# Patient Record
Sex: Male | Born: 1970 | Race: Black or African American | Hispanic: No | Marital: Single | Smoking: Current every day smoker
Health system: Southern US, Community
[De-identification: ages and names within clinical notes are randomized; demographics above are authoritative.]

## PROBLEM LIST (undated history)

## (undated) HISTORY — PX: KNEE SURGERY: SHX244

---

## 2009-02-20 ENCOUNTER — Emergency Department (HOSPITAL_COMMUNITY): Admission: EM | Admit: 2009-02-20 | Discharge: 2009-02-20 | Payer: Self-pay | Admitting: Emergency Medicine

## 2009-03-01 ENCOUNTER — Ambulatory Visit (HOSPITAL_BASED_OUTPATIENT_CLINIC_OR_DEPARTMENT_OTHER): Admission: RE | Admit: 2009-03-01 | Discharge: 2009-03-01 | Payer: Self-pay | Admitting: Plastic Surgery

## 2009-04-04 ENCOUNTER — Ambulatory Visit (HOSPITAL_COMMUNITY): Admission: RE | Admit: 2009-04-04 | Discharge: 2009-04-04 | Payer: Self-pay | Admitting: Plastic Surgery
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
## 2017-10-14 ENCOUNTER — Encounter: Payer: Self-pay | Admitting: Emergency Medicine

## 2017-10-14 ENCOUNTER — Emergency Department: Payer: BLUE CROSS/BLUE SHIELD

## 2017-10-14 ENCOUNTER — Emergency Department
Admission: EM | Admit: 2017-10-14 | Discharge: 2017-10-14 | Disposition: A | Discharge status: Home / Self Care | Payer: BLUE CROSS/BLUE SHIELD | Attending: Emergency Medicine | Admitting: Emergency Medicine

## 2017-10-14 DIAGNOSIS — W01190A Fall on same level from slipping, tripping and stumbling with subsequent striking against furniture, initial encounter: Secondary | ICD-10-CM | POA: Diagnosis not present

## 2017-10-14 DIAGNOSIS — Y929 Unspecified place or not applicable: Secondary | ICD-10-CM | POA: Diagnosis not present

## 2017-10-14 DIAGNOSIS — S0501XA Injury of conjunctiva and corneal abrasion without foreign body, right eye, initial encounter: Secondary | ICD-10-CM

## 2017-10-14 DIAGNOSIS — Y999 Unspecified external cause status: Secondary | ICD-10-CM | POA: Diagnosis not present

## 2017-10-14 DIAGNOSIS — S0990XA Unspecified injury of head, initial encounter: Secondary | ICD-10-CM | POA: Diagnosis present

## 2017-10-14 DIAGNOSIS — F17210 Nicotine dependence, cigarettes, uncomplicated: Secondary | ICD-10-CM | POA: Insufficient documentation

## 2017-10-14 DIAGNOSIS — Y939 Activity, unspecified: Secondary | ICD-10-CM | POA: Diagnosis not present

## 2017-10-14 LAB — COMPREHENSIVE METABOLIC PANEL
ALT: 18 U/L (ref 17–63)
AST: 23 U/L (ref 15–41)
Albumin: 4 g/dL (ref 3.5–5.0)
Alkaline Phosphatase: 46 U/L (ref 38–126)
Anion gap: 6 (ref 5–15)
BUN: 12 mg/dL (ref 6–20)
CO2: 24 mmol/L (ref 22–32)
Calcium: 9 mg/dL (ref 8.9–10.3)
Chloride: 108 mmol/L (ref 101–111)
Creatinine, Ser: 1.18 mg/dL (ref 0.61–1.24)
GFR calc Af Amer: >60 mL/min (ref >60)
GFR calc non Af Amer: >60 mL/min (ref >60)
Glucose, Bld: 104 mg/dL — ABNORMAL HIGH (ref 65–99)
Potassium: 4 mmol/L (ref 3.5–5.1)
Sodium: 138 mmol/L (ref 135–145)
Total Bilirubin: 0.7 mg/dL (ref 0.3–1.2)
Total Protein: 7.5 g/dL (ref 6.5–8.1)

## 2017-10-14 LAB — CBC WITH DIFFERENTIAL/PLATELET
Basophils Absolute: 0.1 10*3/uL (ref 0–0.1)
Basophils Relative: 1 %
Eosinophils Absolute: 0.6 10*3/uL (ref 0–0.7)
Eosinophils Relative: 8 %
HCT: 43.6 % (ref 40.0–52.0)
Hemoglobin: 14.7 g/dL (ref 13.0–18.0)
Lymphocytes Relative: 22 %
Lymphs Abs: 1.8 10*3/uL (ref 1.0–3.6)
MCH: 28.9 pg (ref 26.0–34.0)
MCHC: 33.7 g/dL (ref 32.0–36.0)
MCV: 85.7 fL (ref 80.0–100.0)
Monocytes Absolute: 0.5 10*3/uL (ref 0.2–1.0)
Monocytes Relative: 7 %
Neutro Abs: 4.9 10*3/uL (ref 1.4–6.5)
Neutrophils Relative %: 62 %
Platelets: 216 10*3/uL (ref 150–440)
RBC: 5.08 MIL/uL (ref 4.40–5.90)
RDW: 14.1 % (ref 11.5–14.5)
WBC: 7.9 10*3/uL (ref 3.8–10.6)

## 2017-10-14 MED ORDER — FLUORESCEIN SODIUM 1 MG OP STRP
1.0000 | ORAL_STRIP | Freq: Once | OPHTHALMIC | Status: AC
  Administered 2017-10-14: 1 via OPHTHALMIC
  Filled 2017-10-14: qty 1

## 2017-10-14 MED ORDER — TETRACAINE HCL 0.5 % OP SOLN
1.0000 [drp] | Freq: Once | OPHTHALMIC | Status: AC
  Administered 2017-10-14: 1 [drp] via OPHTHALMIC
  Filled 2017-10-14: qty 4

## 2017-10-14 MED ORDER — DICLOFENAC SODIUM 0.1 % OP SOLN: 1.0000 [drp] | Freq: Four times a day (QID) | OPHTHALMIC | 0 refills | Status: AC

## 2017-10-14 MED ORDER — IOPAMIDOL (ISOVUE-300) INJECTION 61%
75.0000 mL | Freq: Once | INTRAVENOUS | Status: AC | PRN
  Administered 2017-10-14: 75 mL via INTRAVENOUS
  Filled 2017-10-14: qty 75

## 2017-10-14 MED ORDER — POLYMYXIN B-TRIMETHOPRIM 10000-0.1 UNIT/ML-% OP SOLN: 1.0000 [drp] | OPHTHALMIC | 0 refills | Status: AC

## 2017-10-14 NOTE — ED Notes (Signed)
See triage note  States he fell from standing position   Hitting coffee table on Saturday   conts to have headache and right eye pain

## 2017-10-14 NOTE — ED Provider Notes (Signed)
[facility] Emergency Department Provider Note  ____________________________________________  Time seen: Approximately 5:36 PM  I have reviewed the triage vital signs and the nursing notes.   HISTORY  Chief Complaint Eye Pain    HPI Sandi RavelingClifton J Krutz is a 47 y.o. male presents to the emergency department with 10 out of 10 right eye pain for the past 3 days.  Patient reports that 3 days ago, he lost his balance and struck his right forehead against the coffee table.  Patient did not lose consciousness.  Patient states that since fall, he has experienced increased tearing, foreign body sensation, photophobia and occasional blurry vision.  Patient also has pain circumferentially around right orbit.  Patient sustained two abrasions along right forehead and right temple during fall.  Patient wears glasses at baseline but has not been wearing contact lenses.  He denies nausea or vomiting and does not have a history of glaucoma.   History reviewed. No pertinent past medical history.  There are no active problems to display for this patient.   History reviewed. No pertinent surgical history.  Prior to Admission medications   Medication Sig Start Date End Date Taking? Authorizing Provider  diclofenac (VOLTAREN) 0.1 % ophthalmic solution Place 1 drop into the right eye 4 (four) times daily for 5 days. 10/14/17 10/19/17  Orvil FeilWoods, Patricia Fargo M, PA-C  trimethoprim-polymyxin b (POLYTRIM) ophthalmic solution Place 1 drop into the right eye every 4 (four) hours for 10 days. 10/14/17 10/24/17  Orvil FeilWoods, Voris Tigert M, PA-C    Allergies Patient has no known allergies.  No family history on file.  Social History Social History   Tobacco Use  . Smoking status: Current Every Day Smoker    Packs/day: 1.00    Types: Cigarettes  . Smokeless tobacco: Never Used  Substance Use Topics  . Alcohol use: Yes    Frequency: Never    Comment: occas   . Drug use: No     Review of Systems   Constitutional: No fever/chills Eyes: Patient has right eye pain.  ENT: No upper respiratory complaints. Cardiovascular: no chest pain. Respiratory: no cough. No SOB. Gastrointestinal: No abdominal pain.  No nausea, no vomiting.  No diarrhea.  No constipation. Genitourinary: Negative for dysuria. No hematuria Musculoskeletal: Negative for musculoskeletal pain. Skin: Negative for rash, abrasions, lacerations, ecchymosis. Neurological: Negative for headaches, focal weakness or numbness.   ____________________________________________   PHYSICAL EXAM:  VITAL SIGNS: ED Triage Vitals  Enc Vitals Group     BP 10/14/17 1618 139/77     Pulse Rate 10/14/17 1618 99     Resp 10/14/17 1618 16     Temp 10/14/17 1618 98.3 F (36.8 C)     Temp Source 10/14/17 1618 Oral     SpO2 10/14/17 1618 100 %     Weight 10/14/17 1619 (!) 312 lb (141.5 kg)     Height 10/14/17 1619 6\' 2"  (1.88 m)     Head Circumference --      Peak Flow --      Pain Score 10/14/17 1618 6     Pain Loc --      Pain Edu? --      Excl. in GC? --      Constitutional: Alert and oriented. Well appearing and in no acute distress. Eyes: Diffuse scleral injection of the right eye visualized.  Pupils are equal round and reactive to light bilaterally.  Extraocular eye muscles intact.  Patient has sensitivity to light on physical exam.  Patient  had small inferior right corneal abrasion visualized with fluorescein staining. Head: Atraumatic. ENT:      Ears: TMs are pearly bilaterally.      Nose: No congestion/rhinnorhea.      Mouth/Throat: Mucous membranes are moist.  Neck: No stridor. No cervical spine tenderness to palpation. Cardiovascular: Normal rate, regular rhythm. Normal S1 and S2.  Good peripheral circulation. Respiratory: Normal respiratory effort without tachypnea or retractions. Lungs CTAB. Good air entry to the bases with no decreased or absent breath sounds. Gastrointestinal: Bowel sounds 4 quadrants. Soft and  nontender to palpation. No guarding or rigidity. No palpable masses. No distention. No CVA tenderness. Musculoskeletal: Full range of motion to all extremities. No gross deformities appreciated. Neurologic:  Normal speech and language. No gross focal neurologic deficits are appreciated.  Skin:  Skin is warm, dry and intact. No rash noted. Psychiatric: Mood and affect are normal. Speech and behavior are normal. Patient exhibits appropriate insight and judgement.   ____________________________________________   LABS (all labs ordered are listed, but only abnormal results are displayed)  Labs Reviewed  COMPREHENSIVE METABOLIC PANEL - Abnormal; Notable for the following components:      Result Value   Glucose, Bld 104 (*)    All other components within normal limits  CBC WITH DIFFERENTIAL/PLATELET   ____________________________________________  EKG   ____________________________________________  RADIOLOGY Geraldo Pitter, personally viewed and evaluated these images as part of my medical decision making, as well as reviewing the written report by the radiologist.  Ct Head Wo Contrast  Result Date: 10/14/2017 CLINICAL DATA:  Right orbit and facial pain after fall 3 days ago. EXAM: CT HEAD WITHOUT CONTRAST CT MAXILLOFACIAL WITHOUT CONTRAST TECHNIQUE: Multidetector CT imaging of the head and maxillofacial structures were performed using the standard protocol without intravenous contrast. Multiplanar CT image reconstructions of the maxillofacial structures were also generated. COMPARISON:  None. FINDINGS: CT HEAD FINDINGS Brain: Ventricles, cisterns and other CSF spaces are within normal. There is no mass, mass effect, shift of midline structures or acute hemorrhage. No evidence of acute infarction. Vascular: No hyperdense vessel or unexpected calcification. Skull: Normal. Negative for fracture or focal lesion. Sinuses/Orbits: Orbits are within normal. Moderate opacification over the left  frontal sinus and complete opacification of the right maxillary sinus with mild opacification over the left maxillary sinus and ethmoid air cells. Mastoid air cells are clear. Other: None. CT MAXILLOFACIAL FINDINGS Osseous: No acute fracture. Orbits: Orbits are normal and symmetric. Sinuses: Complete opacification of the left frontal sinus with minimal opacification of the ethmoid air cells and left maxillary sinus. Near complete opacification of the right maxillary sinus. Mild deviation of the nasal septum to the left. Right ostiomeatal complex is opacified. Mastoid air cells are clear. Soft tissues: No significant soft tissue injury. There is a linear 2.5 cm radiopaque structure over the left neck extending through the posterior aspect of the sternocleidomastoid muscle at the C4 level. This likely represents a foreign body. IMPRESSION: No acute brain injury. No acute facial bone fracture. Moderate chronic sinus inflammatory change as described worse over the right maxillary sinus and left frontal sinus. 2.5 cm linear radiopaque object over the left neck extending to the posterior aspect of the sternocleidomastoid muscle at the level of C4 likely a foreign body. Electronically Signed   By: Elberta Fortis M.D.   On: 10/14/2017 18:52   Ct Maxillofacial W Contrast  Result Date: 10/14/2017 CLINICAL DATA:  Right orbit and facial pain after fall 3 days ago. EXAM: CT  HEAD WITHOUT CONTRAST CT MAXILLOFACIAL WITHOUT CONTRAST TECHNIQUE: Multidetector CT imaging of the head and maxillofacial structures were performed using the standard protocol without intravenous contrast. Multiplanar CT image reconstructions of the maxillofacial structures were also generated. COMPARISON:  None. FINDINGS: CT HEAD FINDINGS Brain: Ventricles, cisterns and other CSF spaces are within normal. There is no mass, mass effect, shift of midline structures or acute hemorrhage. No evidence of acute infarction. Vascular: No hyperdense vessel or  unexpected calcification. Skull: Normal. Negative for fracture or focal lesion. Sinuses/Orbits: Orbits are within normal. Moderate opacification over the left frontal sinus and complete opacification of the right maxillary sinus with mild opacification over the left maxillary sinus and ethmoid air cells. Mastoid air cells are clear. Other: None. CT MAXILLOFACIAL FINDINGS Osseous: No acute fracture. Orbits: Orbits are normal and symmetric. Sinuses: Complete opacification of the left frontal sinus with minimal opacification of the ethmoid air cells and left maxillary sinus. Near complete opacification of the right maxillary sinus. Mild deviation of the nasal septum to the left. Right ostiomeatal complex is opacified. Mastoid air cells are clear. Soft tissues: No significant soft tissue injury. There is a linear 2.5 cm radiopaque structure over the left neck extending through the posterior aspect of the sternocleidomastoid muscle at the C4 level. This likely represents a foreign body. IMPRESSION: No acute brain injury. No acute facial bone fracture. Moderate chronic sinus inflammatory change as described worse over the right maxillary sinus and left frontal sinus. 2.5 cm linear radiopaque object over the left neck extending to the posterior aspect of the sternocleidomastoid muscle at the level of C4 likely a foreign body. Electronically Signed   By: Elberta Fortis M.D.   On: 10/14/2017 18:52    ____________________________________________    PROCEDURES  Procedure(s) performed:    Procedures  Tonometry readings 22 23 22   Medications  tetracaine (PONTOCAINE) 0.5 % ophthalmic solution 1 drop (1 drop Right Eye Given 10/14/17 1940)  fluorescein ophthalmic strip 1 strip (1 strip Right Eye Given 10/14/17 1940)  iopamidol (ISOVUE-300) 61 % injection 75 mL (75 mLs Intravenous Contrast Given 10/14/17 1809)     ____________________________________________   INITIAL IMPRESSION / ASSESSMENT AND PLAN / ED  COURSE  Pertinent labs & imaging results that were available during my care of the patient were reviewed by me and considered in my medical decision making (see chart for details).  Review of the [place] CSRS was performed in accordance of the [facility] prior to dispensing any controlled drugs.     Assessment and Plan:  Corneal abrasion Patient presents to the emergency department with right eye pain for the past 3 days after experiencing a fall.  Differential diagnosis originally included angle-closure glaucoma, corneal abrasion, orbital cellulitis and foreign body.  CT head revealed no evidence of intracranial hemorrhage.  CT maxillofacial revealed no findings consistent with orbital cellulitis.  A small region of fluorescein uptake was visualized with fluorescein staining, increasing suspicion for corneal abrasion.  Tonometry readings were reassuring.  Patient was treated empirically for corneal abrasion with diclofenac ophthalmic solution and with Polytrim.  A referral was made to ophthalmology and patient was advised to make a follow-up appointment.  Strict return precautions were given to return to the emergency department given new or worsening symptoms.  Vital signs are reassuring prior to discharge. ____________________________________________  FINAL CLINICAL IMPRESSION(S) / ED DIAGNOSES  Final diagnoses:  Abrasion of right cornea, initial encounter      NEW MEDICATIONS STARTED DURING THIS VISIT:  ED Discharge Orders  Ordered    trimethoprim-polymyxin b (POLYTRIM) ophthalmic solution  Every 4 hours     10/14/17 1919    diclofenac (VOLTAREN) 0.1 % ophthalmic solution  4 times daily     10/14/17 1919          This chart was dictated using voice recognition software/Dragon. Despite best efforts to proofread, errors can occur which can change the meaning. Any change was purely unintentional.    Orvil Feil, PA-C 10/14/17 2359    Sharyn Creamer, MD 10/15/17 [phone]

## 2017-10-14 NOTE — ED Triage Notes (Addendum)
Pt to ED via POV with c/o RT eye pain after falling x3days ago. Redness noted to affected eye, pt c/o irritation and sensitivity to light .PT ambulatory , Speech clear, VS stable

## 2018-11-23 IMAGING — CT CT HEAD W/O CM
3 of 8 series · 14 of 47 positions shown, 17 images · non-contrast
Comparison: None.

CLINICAL DATA: Right orbit and facial pain after fall 3 days ago.

EXAM:
CT HEAD WITHOUT CONTRAST
CT MAXILLOFACIAL WITHOUT CONTRAST
TECHNIQUE: Multidetector CT imaging of the head and maxillofacial structures
were performed using the standard protocol without intravenous
contrast. Multiplanar CT image reconstructions of the maxillofacial
structures were also generated.

[Series 4: coronal soft tissue · coronal · 0.31mm/px · 3 of 71 slices shown]
[im 18/71  brain]
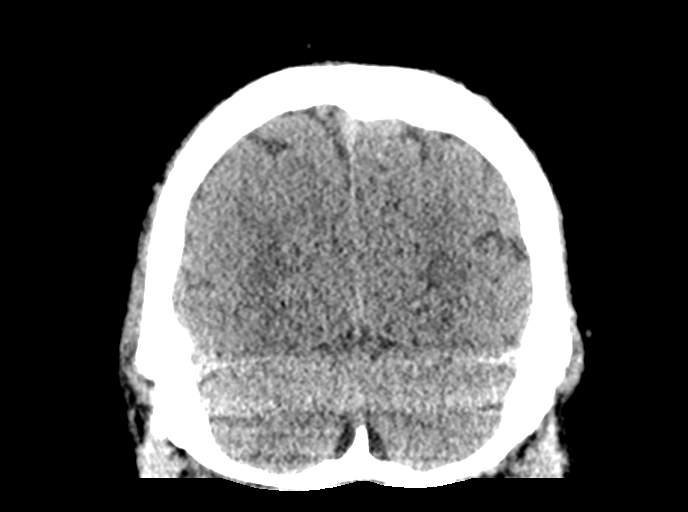
[im 36/71  brain]
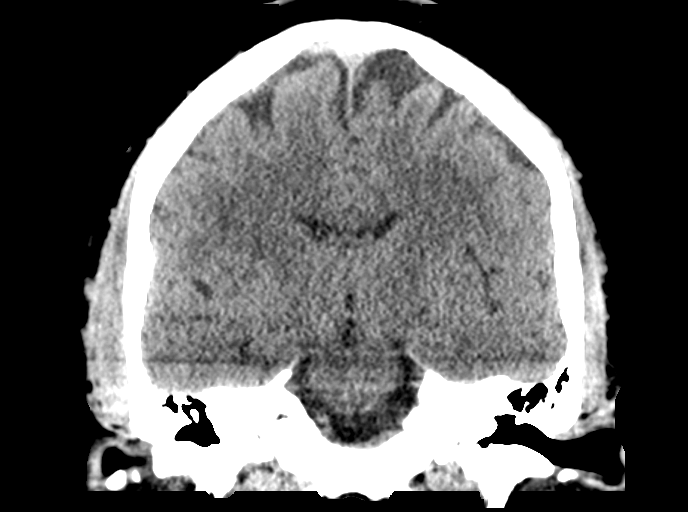
[im 53/71  brain]
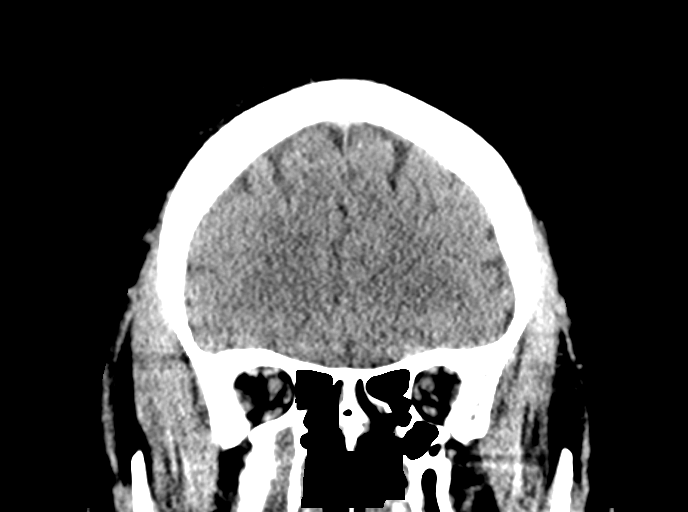

[Series 6: max soft · axial · 0.38mm/px · z∈[-248,-86]mm · 9 of 99 slices shown, 12 images]
[im 9/99  brain]
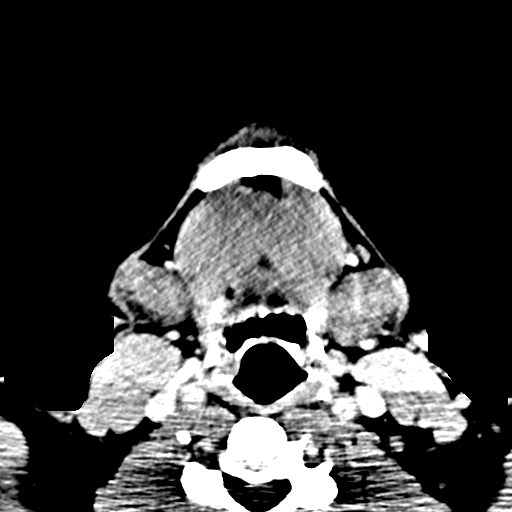
[im 9/99  bone]
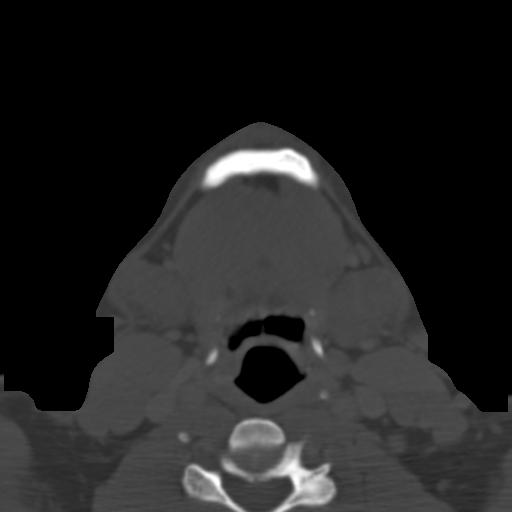
[im 18/99  brain]
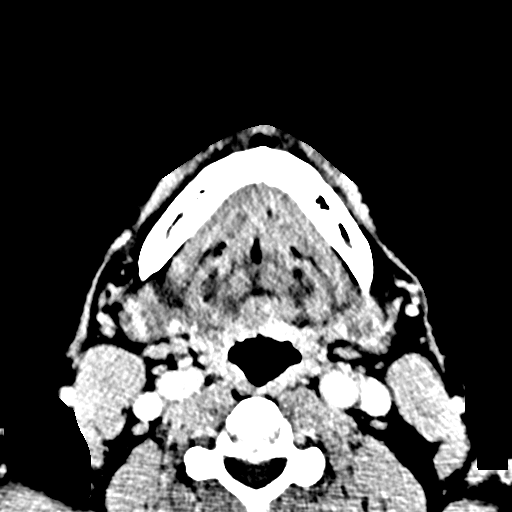
[im 27/99  brain]
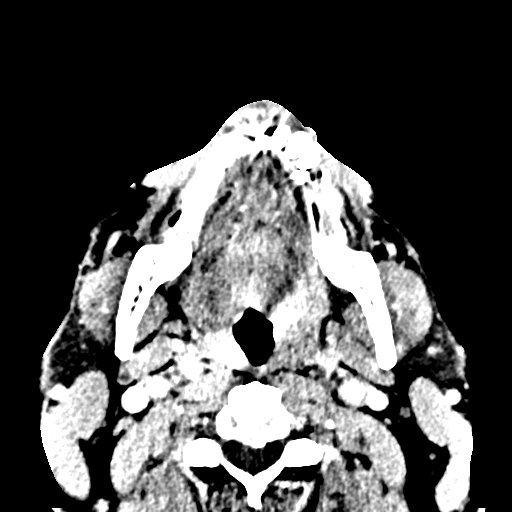
[im 36/99  brain]
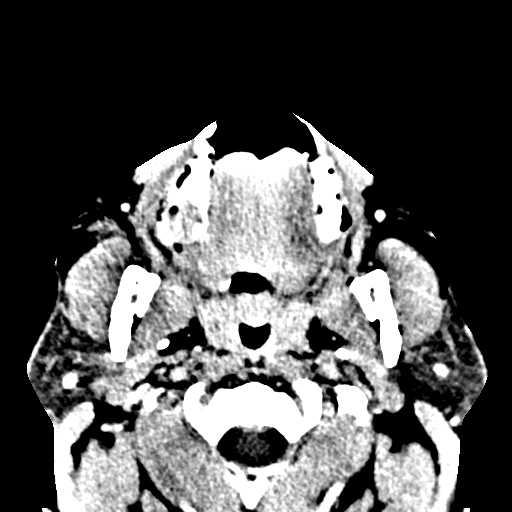
[im 54/99  brain]
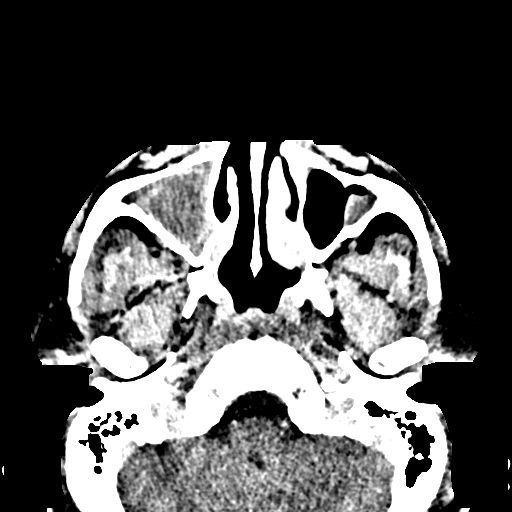
[im 54/99  bone]
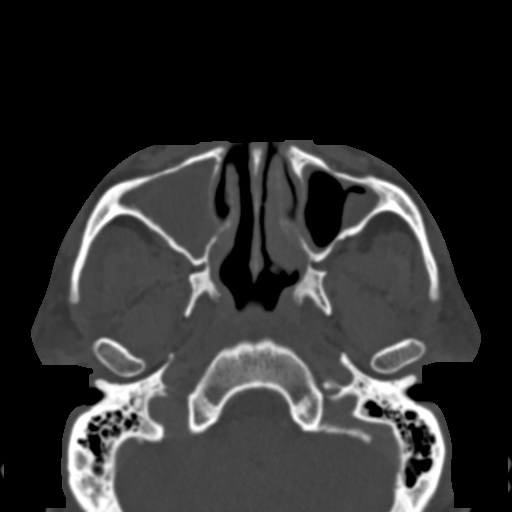
[im 63/99  brain]
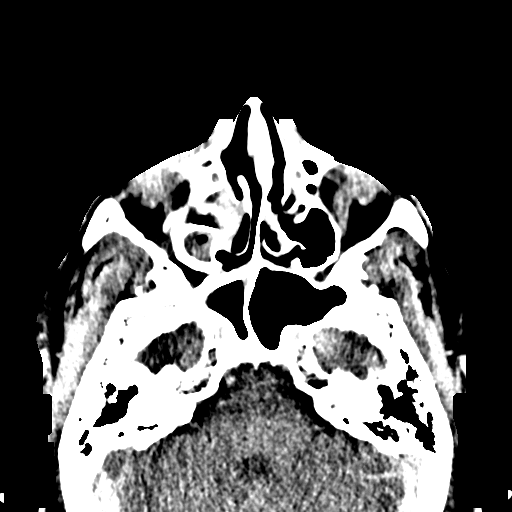
[im 72/99  brain]
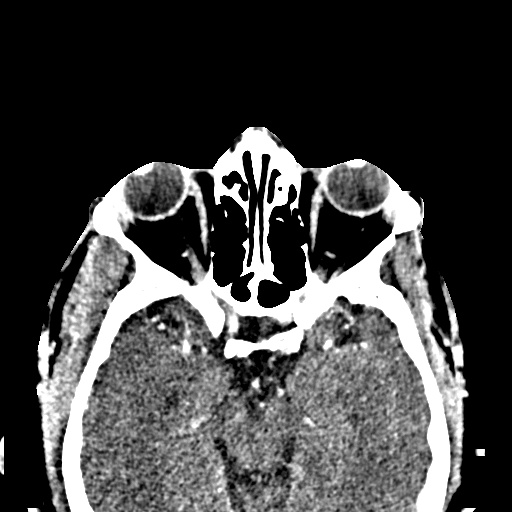
[im 81/99  brain]
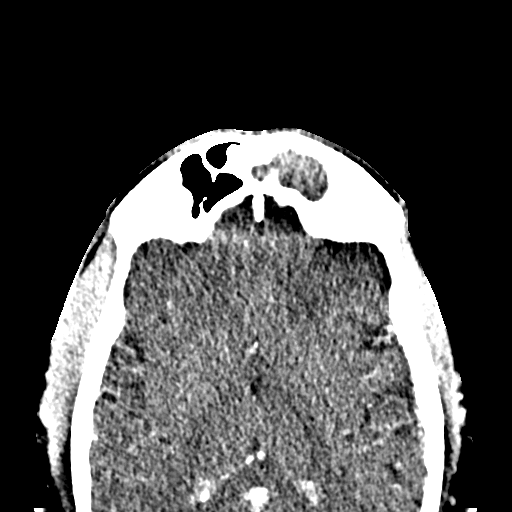
[im 90/99  brain]
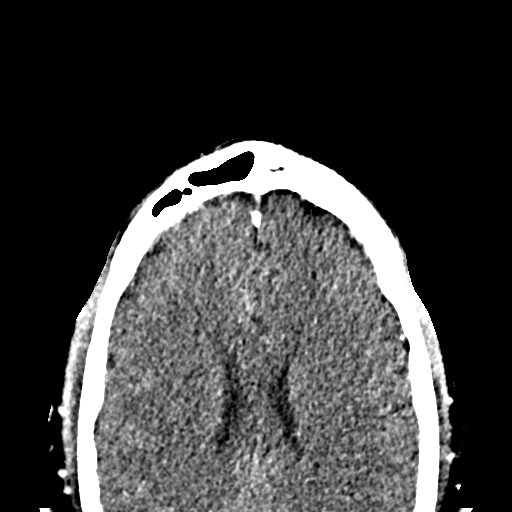
[im 90/99  bone]
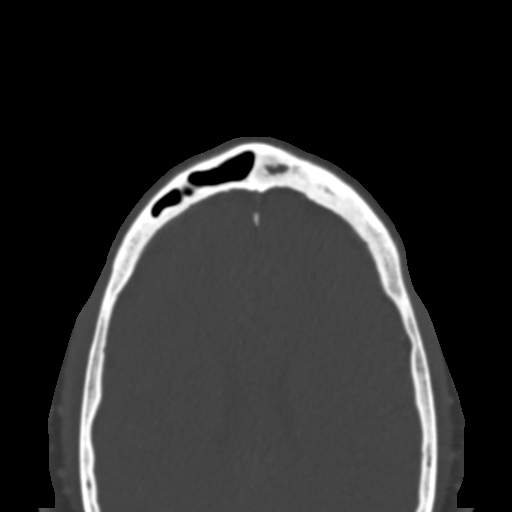

[Series 11: sagittal soft · sagittal · 0.39mm/px · 2 of 93 slices shown]
[im 31/93  brain]
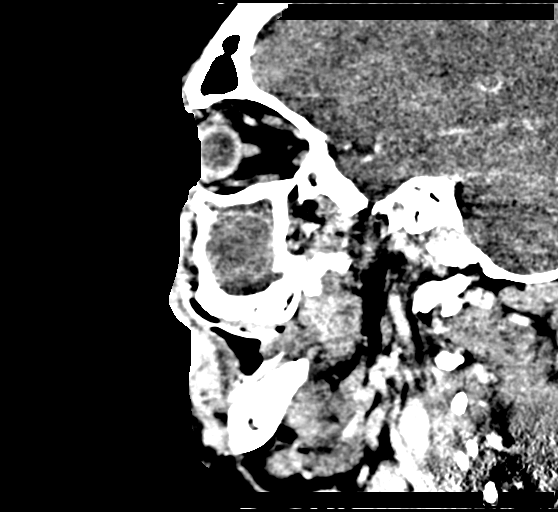
[im 62/93  brain]
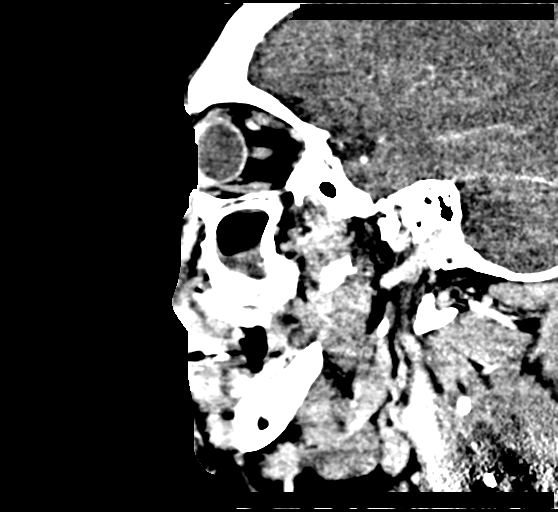

[14 of 47 positions shown; findings below may reference images not displayed]

FINDINGS: CT HEAD FINDINGS

Brain: Ventricles, cisterns and other CSF spaces are within normal.
There is no mass, mass effect, shift of midline structures or acute
hemorrhage. No evidence of acute infarction.

Vascular: No hyperdense vessel or unexpected calcification.

Skull: Normal. Negative for fracture or focal lesion.

Sinuses/Orbits: Orbits are within normal. Moderate opacification
over the left frontal sinus and complete opacification of the right
maxillary sinus with mild opacification over the left maxillary
sinus and ethmoid air cells. Mastoid air cells are clear.

Other: None.

CT MAXILLOFACIAL FINDINGS

Osseous: No acute fracture.

Orbits: Orbits are normal and symmetric.

Sinuses: Complete opacification of the left frontal sinus with
minimal opacification of the ethmoid air cells and left maxillary
sinus. Near complete opacification of the right maxillary sinus.
Mild deviation of the nasal septum to the left. Right ostiomeatal
complex is opacified. Mastoid air cells are clear.

Soft tissues: No significant soft tissue injury. There is a linear
2.5 cm radiopaque structure over the left neck extending through the
posterior aspect of the sternocleidomastoid muscle at the C4 level.
This likely represents a foreign body.
IMPRESSION: No acute brain injury.

No acute facial bone fracture.

Moderate chronic sinus inflammatory change as described worse over
the right maxillary sinus and left frontal sinus.

2.5 cm linear radiopaque object over the left neck extending to the
posterior aspect of the sternocleidomastoid muscle at the level of
C4 likely a foreign body.

## 2020-12-02 ENCOUNTER — Emergency Department
Admission: EM | Admit: 2020-12-02 | Discharge: 2020-12-02 | Disposition: A | Discharge status: Home / Self Care | Payer: BC Managed Care – PPO | Attending: Emergency Medicine | Admitting: Emergency Medicine

## 2020-12-02 ENCOUNTER — Other Ambulatory Visit: Payer: Self-pay

## 2020-12-02 DIAGNOSIS — M5416 Radiculopathy, lumbar region: Secondary | ICD-10-CM | POA: Insufficient documentation

## 2020-12-02 DIAGNOSIS — F17210 Nicotine dependence, cigarettes, uncomplicated: Secondary | ICD-10-CM | POA: Insufficient documentation

## 2020-12-02 DIAGNOSIS — R202 Paresthesia of skin: Secondary | ICD-10-CM | POA: Diagnosis not present

## 2020-12-02 DIAGNOSIS — S39012A Strain of muscle, fascia and tendon of lower back, initial encounter: Secondary | ICD-10-CM | POA: Insufficient documentation

## 2020-12-02 DIAGNOSIS — Y92481 Parking lot as the place of occurrence of the external cause: Secondary | ICD-10-CM | POA: Insufficient documentation

## 2020-12-02 DIAGNOSIS — S3992XA Unspecified injury of lower back, initial encounter: Secondary | ICD-10-CM | POA: Diagnosis present

## 2020-12-02 MED ORDER — MELOXICAM 15 MG PO TABS: 15.0000 mg | ORAL_TABLET | Freq: Every day | ORAL | 0 refills | Status: AC

## 2020-12-02 MED ORDER — CYCLOBENZAPRINE HCL 5 MG PO TABS: 5.0000 mg | ORAL_TABLET | Freq: Three times a day (TID) | ORAL | 0 refills | Status: AC | PRN
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
## 2020-12-02 NOTE — Discharge Instructions (Signed)
Please stretch lower back.  Walking is good to keep the lower lumbar spine muscles loose.  You may ice over the next 24 to 48 hours then transition over to heat.  Take anti-inflammatory medication as well as muscle relaxer as needed.  Return to the ER for any increased pain, weakness, worsening symptoms or urgent changes in your health.  Follow-up PCP if no improvement 1 week.
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
## 2020-12-02 NOTE — ED Notes (Signed)
ED Provider at bedside. 

## 2021-03-01 ENCOUNTER — Encounter: Payer: Self-pay | Admitting: Family Medicine

## 2021-03-01 ENCOUNTER — Ambulatory Visit: Payer: Self-pay | Admitting: Family Medicine

## 2021-03-01 ENCOUNTER — Other Ambulatory Visit: Payer: Self-pay

## 2021-03-01 DIAGNOSIS — N341 Nonspecific urethritis: Secondary | ICD-10-CM

## 2021-03-01 DIAGNOSIS — Z113 Encounter for screening for infections with a predominantly sexual mode of transmission: Secondary | ICD-10-CM

## 2021-03-01 LAB — GRAM STAIN

## 2021-03-01 MED ORDER — DOXYCYCLINE HYCLATE 100 MG PO TABS: 100.0000 mg | ORAL_TABLET | Freq: Two times a day (BID) | ORAL | 0 refills | Status: AC

## 2021-03-01 NOTE — Progress Notes (Signed)
   [facility] Department STI clinic/screening visit  Subjective:  Jared Harrison is a 50 y.o. male being seen today for an STI screening visit. The patient reports they do not have symptoms.    Patient has the following medical conditions:  There are no problems to display for this patient.    Chief Complaint  Patient presents with  . SEXUALLY TRANSMITTED DISEASE    screening    HPI  Patient reports here for screening, reports that condom broke.     See flowsheet for further details and programmatic requirements.    The following portions of the patient's history were reviewed and updated as appropriate: allergies, current medications, past medical history, past social history, past surgical history and problem list.  Objective:  There were no vitals filed for this visit.  Physical Exam Constitutional:      Appearance: Normal appearance.  HENT:     Head: Normocephalic.     Mouth/Throat:     Mouth: Mucous membranes are moist.     Pharynx: Oropharynx is clear. No oropharyngeal exudate.  Musculoskeletal:     Cervical back: Normal range of motion.  Lymphadenopathy:     Cervical: No cervical adenopathy.  Skin:    General: Skin is warm and dry.     Findings: No bruising, erythema, lesion or rash.  Neurological:     Mental Status: He is alert.  Psychiatric:        Mood and Affect: Mood normal.        Behavior: Behavior normal.       Assessment and Plan:  Jared Harrison is a 50 y.o. male presenting to the [facility] Department for STI screening  1. Screening examination for venereal disease  - HBV Antigen/Antibody State [facility] - HIV/HCV [place] [facility] - Syphilis Serology, [place] [facility] - Gonococcus culture - Gram stain - Gonococcus culture  Patient does not have STI symptoms Patient accepted all screenings including  Gram stain,  oral, urethral, GC and bloodwork for HIV/RPR.  Patient meets criteria for HepB screening? Yes. Ordered?  Yes Patient meets criteria for HepC screening? Yes. Ordered? Yes Recommended condom use with all sex Discussed importance of condom use for STI prevent  Discussed time line for State Lab results and that patient will be called with positive results and encouraged patient to call if he had not heard in 2 weeks Recommended returning for continued or worsening symptoms.    2. NGU (nongonococcal urethritis)  Gram stain + for NGU  - doxycycline (VIBRA-TABS) 100 MG tablet; Take 1 tablet (100 mg total) by mouth 2 (two) times daily for 7 days.  Dispense: 14 tablet; Refill: 0   Return for as needed.  No future appointments.  Wendi Snipes, FNP

## 2021-03-01 NOTE — Progress Notes (Signed)
Pt here for STD screening.  Gram Stain results reviewed with Provider.  Medication dispensed per Provider orders.  Condoms given. Saul Dorsi M Theone Bowell, RN  

## 2021-03-05 LAB — GONOCOCCUS CULTURE

## 2021-03-07 LAB — HM HEPATITIS C SCREENING LAB: HM Hepatitis Screen: NEGATIVE

## 2021-03-07 LAB — HM HIV SCREENING LAB: HM HIV Screening: NEGATIVE

## 2021-03-07 LAB — HEPATITIS B SURFACE ANTIGEN

## 2021-03-26 ENCOUNTER — Telehealth: Payer: Self-pay | Admitting: Family Medicine

## 2021-03-26 NOTE — Telephone Encounter (Signed)
Patient wants to discuss the results of his last visit for STI checkup. Please call.

## 2021-03-27 NOTE — Telephone Encounter (Signed)
Phone call to pt. Pt did not remember leaving a password at last visit, would like copies of results. Pt scheduled for TR appt for 03/28/21.

## 2021-03-28 ENCOUNTER — Ambulatory Visit: Payer: Self-pay

## 2021-03-28 ENCOUNTER — Other Ambulatory Visit: Payer: Self-pay

## 2021-03-28 DIAGNOSIS — Z719 Counseling, unspecified: Secondary | ICD-10-CM

## 2021-03-28 NOTE — Progress Notes (Signed)
ROI signed and provided pt with copies of TR from 03/01/21 visit.

## 2021-12-03 ENCOUNTER — Ambulatory Visit: Payer: 59

## 2021-12-24 ENCOUNTER — Other Ambulatory Visit: Payer: Self-pay

## 2021-12-24 ENCOUNTER — Ambulatory Visit: Payer: 59 | Admitting: Nurse Practitioner

## 2021-12-24 ENCOUNTER — Encounter: Payer: Self-pay | Admitting: Nurse Practitioner

## 2021-12-24 DIAGNOSIS — Z113 Encounter for screening for infections with a predominantly sexual mode of transmission: Secondary | ICD-10-CM

## 2021-12-24 LAB — HEPATITIS B SURFACE ANTIGEN: Hepatitis B Surface Ag: NONREACTIVE

## 2021-12-24 LAB — HM HEPATITIS C SCREENING LAB: HM Hepatitis Screen: NEGATIVE

## 2021-12-24 LAB — HM HIV SCREENING LAB: HM HIV Screening: NEGATIVE

## 2021-12-24 NOTE — Progress Notes (Signed)
Pt here for STD testing and exposure to trich. Providers orders completed.  ?Bwiedenheft RN ?

## 2021-12-24 NOTE — Progress Notes (Signed)
[facility] Department ?STI clinic/screening visit ? ?Subjective:  ?Jared Harrison is a 51 y.o. male being seen today for an STI screening visit. The patient reports they do not have symptoms.   ? ?Patient has the following medical conditions:  There are no problems to display for this patient. ? ? ? ?Chief Complaint  ?Patient presents with  ? SEXUALLY TRANSMITTED DISEASE  ?  Pt here for exposure to trich. Wants all std tests done. No symptoms  ? ? ?HPI ? ?Patient reports to clinic for STD screening.  Patient states he was treated as a contact for [facility] 2-3 weeks ago, would like additional testing today.   ? ?Does the patient or their partner desires a pregnancy in the next year? No ? ?Screening for MPX risk: ?Does the patient have an unexplained rash? No ?Is the patient MSM? No ?Does the patient endorse multiple sex partners or anonymous sex partners? No ?Did the patient have close or sexual contact with a person diagnosed with MPX? No ?Has the patient traveled outside the [place] where MPX is endemic? No ?Is there a high clinical suspicion for MPX-- evidenced by one of the following No ? -Unlikely to be chickenpox ? -Lymphadenopathy ? -Rash that present in same phase of evolution on any given body part ? ? ?See flowsheet for further details and programmatic requirements.  ? ? ?The following portions of the patient's history were reviewed and updated as appropriate: allergies, current medications, past medical history, past social history, past surgical history and problem list. ? ?Objective:  ?There were no vitals filed for this visit. ? ?Physical Exam ?Constitutional:   ?   Appearance: Normal appearance.  ?HENT:  ?   Head: Normocephalic.  ?   Right Ear: External ear normal.  ?   Left Ear: External ear normal.  ?   Nose: Nose normal.  ?   Mouth/Throat:  ?   Mouth: Mucous membranes are moist.  ?   Comments: Top and bottom dentures  ?Pulmonary:  ?   Effort: Pulmonary effort is normal.  ?Abdominal:  ?    General: Abdomen is flat.  ?   Palpations: Abdomen is soft.  ?Genitourinary: ?   Penis: Circumcised.   ?   Comments: Pubic area without nits, lice, hair loss, edema, erythema, lesions and inguinal adenopathy. ?Penis without rash, lesions and discharge at meatus. ?Testicles descended bilaterally,nt, no masses or edema.  ?Musculoskeletal:  ?   Cervical back: Full passive range of motion without pain, normal range of motion and neck supple.  ?Skin: ?   General: Skin is warm and dry.  ?Neurological:  ?   Mental Status: He is alert and oriented to person, place, and time.  ?Psychiatric:     ?   Attention and Perception: Attention normal.     ?   Mood and Affect: Mood normal.     ?   Speech: Speech normal.     ?   Behavior: Behavior is cooperative.  ? ? ? ? ?Assessment and Plan:  ?Jared Harrison is a 51 y.o. male presenting to the [facility] Department for STI screening ? ?1. Screening examination for venereal disease ?-51 year male in clinic today for STD screening.  ?-Patient does not have STI symptoms ?Patient accepted all screenings including urine CT/GC and bloodwork for HIV/RPR.  ?Patient meets criteria for HepB screening? Yes. Ordered? Yes ?Patient meets criteria for HepC screening? Yes. Ordered? Yes ?Recommended condom use with all sex ?Discussed  importance of condom use for STI prevent ? ? ?Discussed time line for State Lab results and that patient will be called with positive results and encouraged patient to call if he had not heard in 2 weeks ?Recommended returning for continued or worsening symptoms.   ? ?- HIV/HCV [place] [facility] ?- Syphilis Serology, [place] [facility] ?- HBV Antigen/Antibody [facility] ?- Chlamydia/Gonorrhea [place] [facility] ? ? ? ? ?Return if symptoms worsen or fail to improve. ? ?No future appointments. ? ?Gregary Cromer, FNP ? ?

## 2022-10-03 ENCOUNTER — Emergency Department
Admission: EM | Admit: 2022-10-03 | Discharge: 2022-10-03 | Disposition: A | Discharge status: Home / Self Care | Payer: BC Managed Care – PPO | Attending: Emergency Medicine | Admitting: Emergency Medicine

## 2022-10-03 ENCOUNTER — Other Ambulatory Visit: Payer: Self-pay

## 2022-10-03 ENCOUNTER — Encounter: Payer: Self-pay | Admitting: Emergency Medicine

## 2022-10-03 DIAGNOSIS — M5432 Sciatica, left side: Secondary | ICD-10-CM | POA: Insufficient documentation

## 2022-10-03 DIAGNOSIS — U071 COVID-19: Secondary | ICD-10-CM | POA: Insufficient documentation

## 2022-10-03 DIAGNOSIS — R0981 Nasal congestion: Secondary | ICD-10-CM | POA: Diagnosis present

## 2022-10-03 LAB — RESP PANEL BY RT-PCR (FLU A&B, COVID) ARPGX2
Influenza A by PCR: NEGATIVE
Influenza B by PCR: NEGATIVE
SARS Coronavirus 2 by RT PCR: POSITIVE — AB

## 2022-10-03 MED ORDER — IBUPROFEN 600 MG PO TABS
600.0000 mg | ORAL_TABLET | Freq: Once | ORAL | Status: AC
  Administered 2022-10-03: 600 mg via ORAL
  Filled 2022-10-03: qty 1

## 2022-10-03 MED ORDER — PREDNISONE 20 MG PO TABS: 40.0000 mg | ORAL_TABLET | Freq: Every day | ORAL | 0 refills | Status: AC

## 2022-10-03 MED ORDER — PREDNISONE 20 MG PO TABS
60.0000 mg | ORAL_TABLET | Freq: Once | ORAL | Status: AC
  Administered 2022-10-03: 60 mg via ORAL
  Filled 2022-10-03: qty 3

## 2022-10-03 NOTE — ED Notes (Signed)
See triage note. Presents with pain to left leg  States pain starts at left hip area nad moves into lag  Also having some pressure to sinus area  Afebrile on arrival

## 2022-10-03 NOTE — ED Triage Notes (Addendum)
C/O left leg, thigh, pain / numbness when walking x 1 week.  States "tweeked left lower back last Thursday"

## 2022-10-03 NOTE — ED Provider Notes (Signed)
[facility] Provider Note    Event Date/Time   First MD Initiated Contact with Patient 10/03/22 1619     (approximate)   History   Leg Pain   HPI  Jared Harrison is a 52 y.o. male   reports no major past medical history.  He comes today for evaluation he reports that he has had for at least a few weeks time maybe a month some sinus congestion.  Comes and goes.  He has had a bit of a runny nose with and seems to think it started at least a couple weeks ago.  No headaches no fevers no chills no bodyaches no cough no shortness of breath  He also reports that he has come today for evaluation as he has been having pain in his lower buttock that radiates out towards the side of his upper thigh.  It comes and goes.  Seems to get better when he lays down and worse when he is standing or moving things about.  He had this come and go multiple times in his life it usually last for a little while and then goes away.  When the pain is present makes it hard for him to walk he reports it feels like a tweaking sensation in his lower back.  Started around Thursday of last week.  No bowel or bladder changes.  No fever.      Physical Exam   Triage Vital Signs: ED Triage Vitals  Enc Vitals Group     BP 10/03/22 1541 (!) 158/99     Pulse Rate 10/03/22 1541 94     Resp 10/03/22 1541 16     Temp 10/03/22 1541 98.1 F (36.7 C)     Temp src --      SpO2 10/03/22 1541 98 %     Weight 10/03/22 1626 203 lb 14.8 oz (92.5 kg)     Height 10/03/22 1626 6\' 2"  (1.88 m)     Head Circumference --      Peak Flow --      Pain Score 10/03/22 1541 5     Pain Loc --      Pain Edu? --      Excl. in [place]? --     Most recent vital signs: Vitals:   10/03/22 1541  BP: (!) 158/99  Pulse: 94  Resp: 16  Temp: 98.1 F (36.7 C)  SpO2: 98%     General: Awake, no distress.  Pleasant.  Occasional sniffles. CV:  Good peripheral perfusion.  Normal tones and right.  Palpable warm  well-perfused left lower extremity with dorsalis pedis and posterior tibial pulses present.  Normal neurologic exam including sensation and strength in the left lower extremity throughout. Resp:  Normal effort.  Clear bilaterally Abd:  No distention.  Soft nontender nondistended Other:  Lung sounds clear.  Normal work of breathing.  Alert oriented nontoxic able to bear weight on the left lower extremity.  No swollen joints or overlying erythema over any joint line.  No focal bony tenderness.  Demonstrates area that causes pain starts in his left buttock radiates out across the left posterior thigh and terminates somewhere around the knee area.  No swelling in the extremity no venous cords or congestion.  No calf tenderness.   ED Results / Procedures / Treatments   Labs (all labs ordered are listed, but only abnormal results are displayed) Labs Reviewed  RESP PANEL BY RT-PCR (FLU A&B, COVID) ARPGX2 - Abnormal;  Notable for the following components:      Result Value   SARS Coronavirus 2 by RT PCR POSITIVE (*)    All other components within normal limits     EKG     RADIOLOGY     PROCEDURES:  Critical Care performed: No  Procedures   MEDICATIONS ORDERED IN ED: Medications  predniSONE (DELTASONE) tablet 60 mg (60 mg Oral Given 10/03/22 1703)  ibuprofen (ADVIL) tablet 600 mg (600 mg Oral Given 10/03/22 1703)     IMPRESSION / MDM / ASSESSMENT AND PLAN / ED COURSE  I reviewed the triage vital signs and the nursing notes.                              Differential diagnosis includes, but is not limited to, possible sinusitis, sinus infection viral syndrome.  The patient tested positive for COVID-19.  Based on risk factors and also on longevity of illness for which she reports has had sinus symptoms for least 5 or more days possibly up to 3 weeks he is outside the initiation window for specific antiviral therapy or Paxlovid.  He is nontoxic well-appearing in no acute distress.  He  has no evidence to support superinfection or acute pulmonary symptomatology  In addition he has left lower buttock pain that is consistent with sciatica he reports a history of it coming and going before.  He has no red flags or neurologic deficits.  Will treat with steroid burst and NSAID for relief.  I did recommend he establish and follow-up with primary care as well.  He is agreeable with this plan  Patient's presentation is most consistent with acute complicated illness / injury requiring diagnostic workup.   Return precautions and treatment recommendations and follow-up discussed with the patient who is agreeable with the plan.        FINAL CLINICAL IMPRESSION(S) / ED DIAGNOSES   Final diagnoses:  Sciatica of left side  COVID-19     Rx / DC Orders   ED Discharge Orders          Ordered    predniSONE (DELTASONE) 20 MG tablet  Daily with breakfast        10/03/22 1752             Note:  This document was prepared using Dragon voice recognition software and may include unintentional dictation errors.   Delman Kitten, MD 10/03/22 [id number]

## 2022-10-03 NOTE — Discharge Instructions (Addendum)
Please go to the following website to schedule new (and existing) patient appointments:   [email or web address]   The following is a list of primary care offices in the area who are accepting new patients at this time.  Please reach out to one of them directly and let them know you would like to schedule an appointment to follow up on an Emergency Department visit, and/or to establish a new primary care provider (PCP).  There are likely other primary care clinics in the are who are accepting new patients, but this is an excellent place to start:  [facility] Lead physician: Dr Angela Bacigalupo [address] [place], [place] [address] [phone]  [facility] Lead Physician: Dr Krichna Sowles [address], [place], [place] [address] [phone]  [facility]  Lead Physician: Dr Megan Johnson [address], [place], [place] [address] [phone]  [facility] [place] [facility] Lead Physician: Dr Alex Karamalegos [address], [place], [place] [address] [phone]  [facility] [place] Lead Physician: Dr Laura Berglund [address], [place], [place] [address] [phone]
